# Patient Record
Sex: Female | Born: 1985 | Race: White | Hispanic: No | Smoking: Never smoker
Health system: Southern US, Community
[De-identification: ages and names within clinical notes are randomized; demographics above are authoritative.]

## PROBLEM LIST (undated history)

## (undated) DIAGNOSIS — R011 Cardiac murmur, unspecified: Secondary | ICD-10-CM

## (undated) DIAGNOSIS — M5134 Other intervertebral disc degeneration, thoracic region: Secondary | ICD-10-CM

## (undated) HISTORY — PX: TUBAL LIGATION: SHX77

---

## 2016-06-14 ENCOUNTER — Encounter (HOSPITAL_COMMUNITY): Payer: Self-pay | Admitting: Cardiology

## 2016-06-14 ENCOUNTER — Emergency Department (HOSPITAL_COMMUNITY)
Admission: EM | Admit: 2016-06-14 | Discharge: 2016-06-14 | Disposition: A | Discharge status: Home / Self Care | Payer: Worker's Compensation | Attending: Emergency Medicine | Admitting: Emergency Medicine

## 2016-06-14 ENCOUNTER — Emergency Department (HOSPITAL_COMMUNITY): Payer: Worker's Compensation

## 2016-06-14 DIAGNOSIS — Y9389 Activity, other specified: Secondary | ICD-10-CM | POA: Diagnosis not present

## 2016-06-14 DIAGNOSIS — Y92410 Unspecified street and highway as the place of occurrence of the external cause: Secondary | ICD-10-CM | POA: Diagnosis not present

## 2016-06-14 DIAGNOSIS — Z91040 Latex allergy status: Secondary | ICD-10-CM | POA: Diagnosis not present

## 2016-06-14 DIAGNOSIS — Y990 Civilian activity done for income or pay: Secondary | ICD-10-CM | POA: Insufficient documentation

## 2016-06-14 DIAGNOSIS — S8391XA Sprain of unspecified site of right knee, initial encounter: Secondary | ICD-10-CM | POA: Diagnosis not present

## 2016-06-14 DIAGNOSIS — M79604 Pain in right leg: Secondary | ICD-10-CM | POA: Diagnosis present

## 2016-06-14 HISTORY — DX: Cardiac murmur, unspecified: R01.1

## 2016-06-14 HISTORY — DX: Other intervertebral disc degeneration, thoracic region: M51.34

## 2016-06-14 LAB — POC URINE PREG, ED: Preg Test, Ur: NEGATIVE

## 2016-06-14 MED ORDER — OXYCODONE-ACETAMINOPHEN 5-325 MG PO TABS
1.0000 | ORAL_TABLET | Freq: Once | ORAL | Status: AC
  Administered 2016-06-14: 1 via ORAL
  Filled 2016-06-14: qty 1

## 2016-06-14 MED ORDER — OXYCODONE-ACETAMINOPHEN 5-325 MG PO TABS: 1.0000 | ORAL_TABLET | Freq: Four times a day (QID) | ORAL | 0 refills | Status: DC | PRN

## 2016-06-14 MED ORDER — IBUPROFEN 600 MG PO TABS: 600.0000 mg | ORAL_TABLET | Freq: Three times a day (TID) | ORAL | 0 refills | Status: DC | PRN
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
## 2016-06-14 NOTE — ED Notes (Signed)
MD at bedside. 
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
## 2016-06-14 NOTE — ED Notes (Signed)
Pt ambulatory with no difficulties noted.

## 2016-06-16 NOTE — ED Notes (Signed)
Pt called back inquiring about a work note. 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  pt reported would come and pick up note.

## 2016-06-25 ENCOUNTER — Emergency Department (HOSPITAL_COMMUNITY): Payer: Worker's Compensation

## 2016-06-25 ENCOUNTER — Encounter (HOSPITAL_COMMUNITY): Payer: Self-pay | Admitting: Emergency Medicine

## 2016-06-25 ENCOUNTER — Emergency Department (HOSPITAL_COMMUNITY)
Admission: EM | Admit: 2016-06-25 | Discharge: 2016-06-25 | Disposition: A | Discharge status: Home / Self Care | Payer: Worker's Compensation | Attending: Emergency Medicine | Admitting: Emergency Medicine

## 2016-06-25 DIAGNOSIS — Y939 Activity, unspecified: Secondary | ICD-10-CM | POA: Insufficient documentation

## 2016-06-25 DIAGNOSIS — Y999 Unspecified external cause status: Secondary | ICD-10-CM | POA: Insufficient documentation

## 2016-06-25 DIAGNOSIS — M25461 Effusion, right knee: Secondary | ICD-10-CM | POA: Insufficient documentation

## 2016-06-25 DIAGNOSIS — Y92410 Unspecified street and highway as the place of occurrence of the external cause: Secondary | ICD-10-CM | POA: Insufficient documentation

## 2016-06-25 DIAGNOSIS — Z791 Long term (current) use of non-steroidal anti-inflammatories (NSAID): Secondary | ICD-10-CM | POA: Insufficient documentation

## 2016-06-25 DIAGNOSIS — M25561 Pain in right knee: Secondary | ICD-10-CM | POA: Diagnosis not present

## 2016-06-25 MED ORDER — HYDROCODONE-ACETAMINOPHEN 5-325 MG PO TABS: 2.0000 | ORAL_TABLET | ORAL | 0 refills | Status: AC | PRN

## 2016-06-25 MED ORDER — IBUPROFEN 800 MG PO TABS: 800.0000 mg | ORAL_TABLET | Freq: Three times a day (TID) | ORAL | 0 refills | Status: AC

## 2016-06-25 NOTE — ED Notes (Signed)
Pt to xray at this time.
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
## 2016-06-25 NOTE — ED Notes (Signed)
Pt ambulatory to restroom. States understanding of d/c instructions, follow up care, and need of knee immobilizer X1 week. No questions at this time.

## 2017-05-16 IMAGING — DX DG KNEE COMPLETE 4+V*R*
4 series · 4 of 4 positions shown · non-contrast
Comparison: None.

CLINICAL DATA: Right knee pain

EXAM:
RIGHT KNEE - COMPLETE 4+ VIEW

[knee ap]
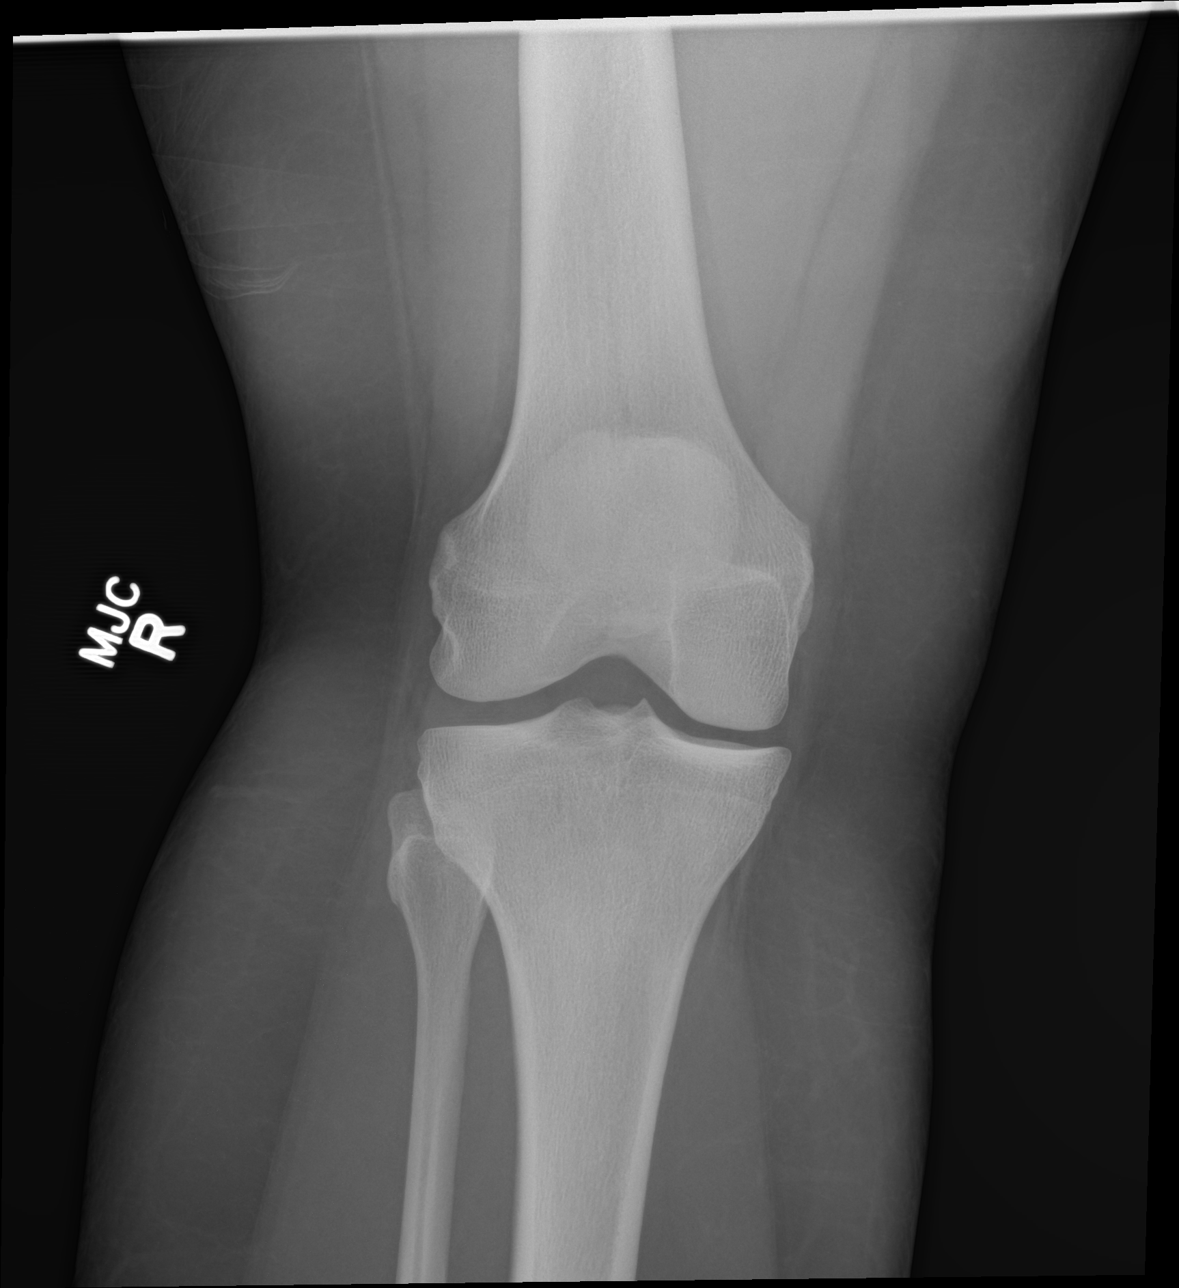

[knee obl (1 of 2)]
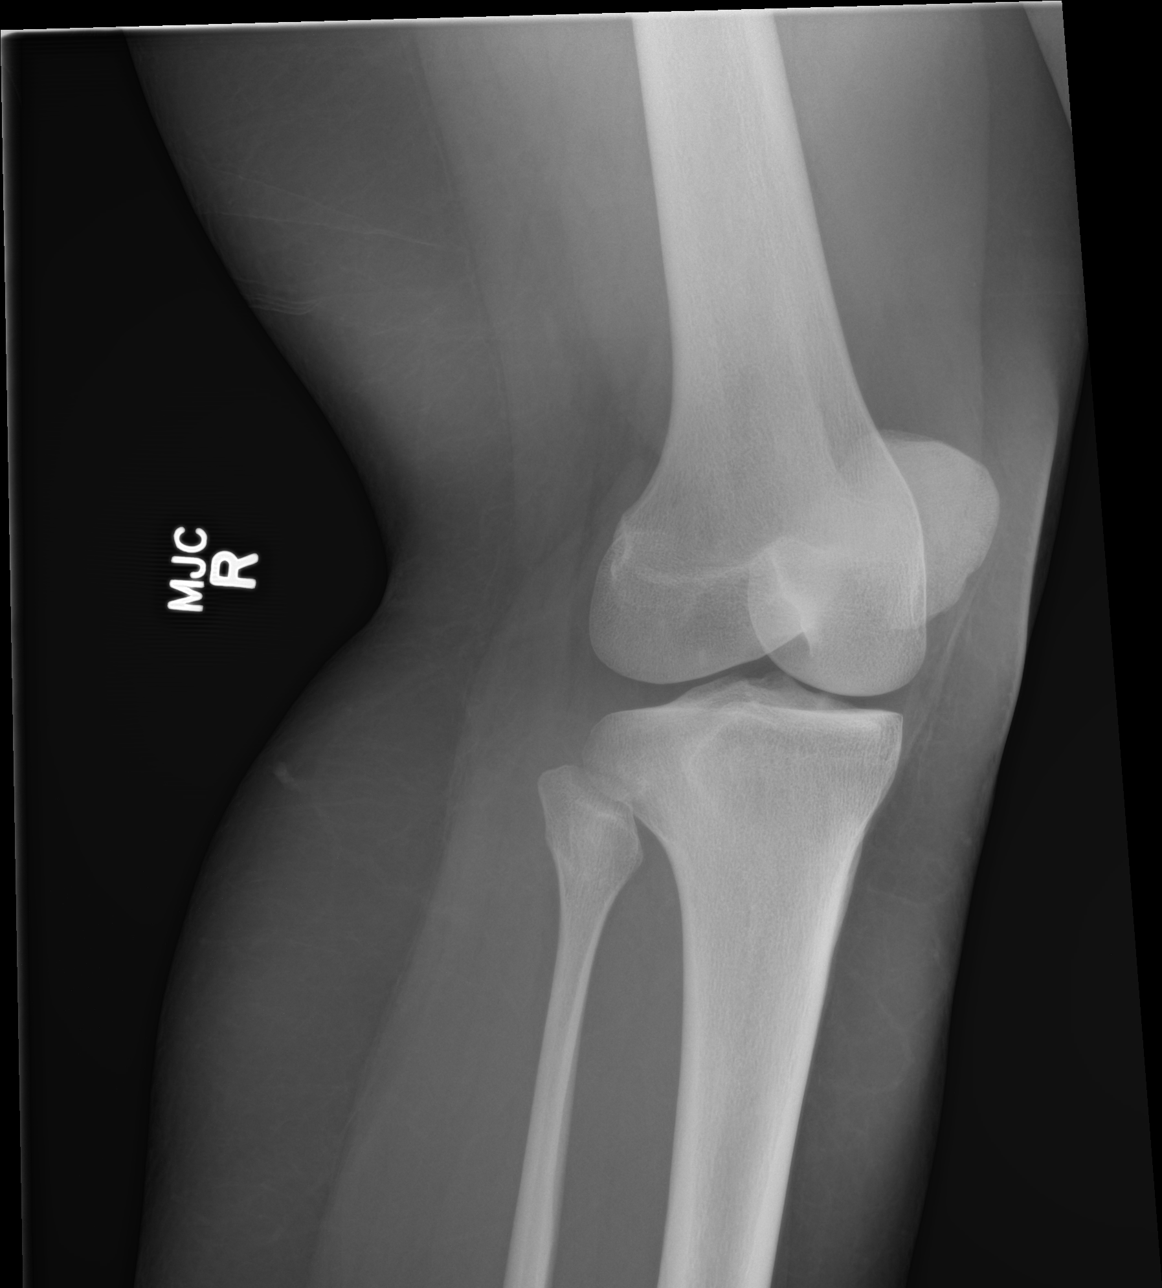

[knee obl (2 of 2)]
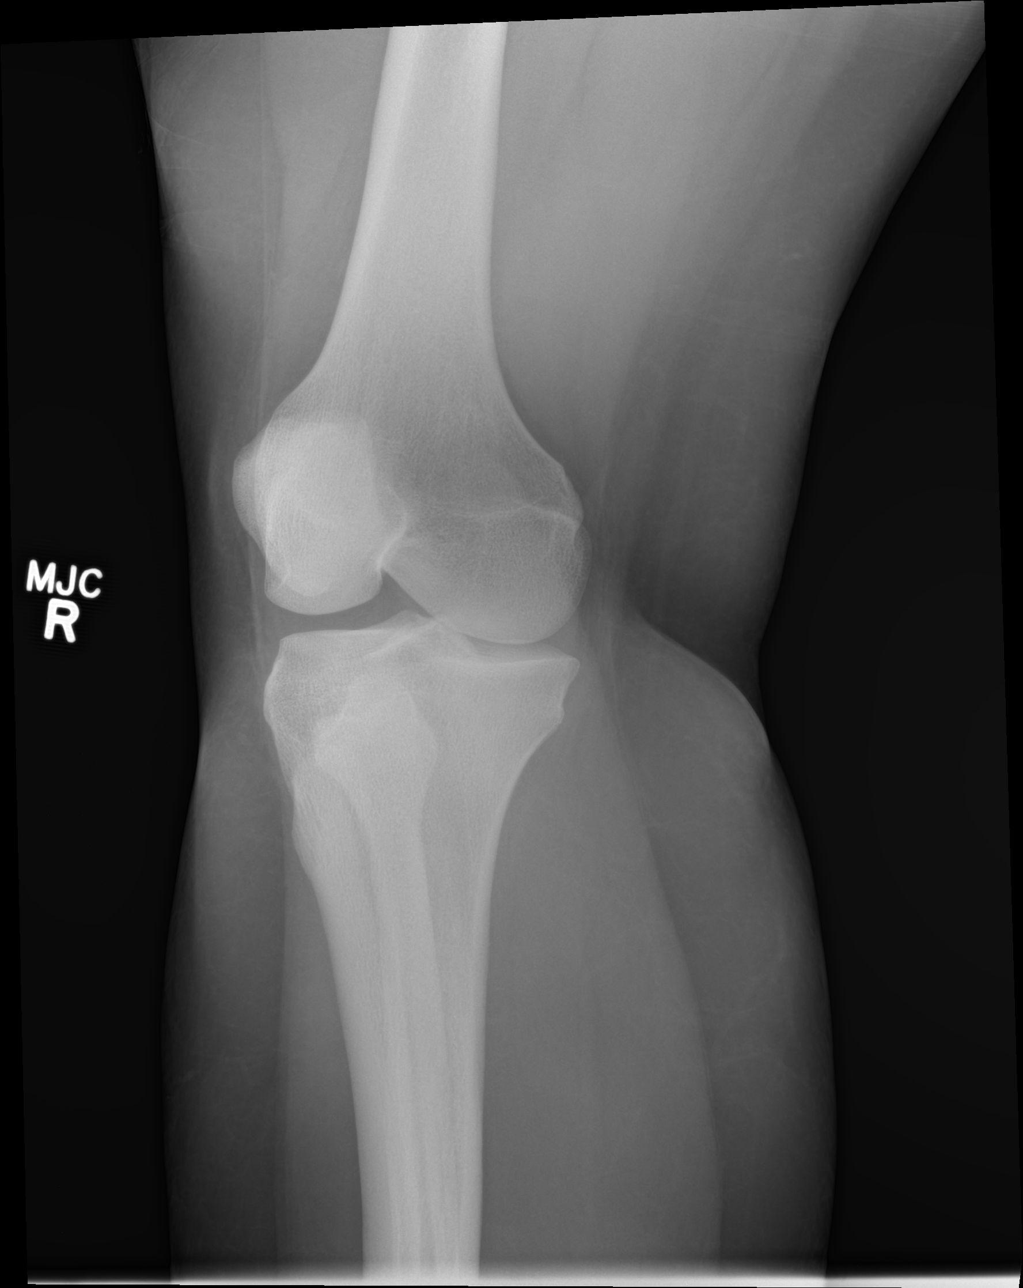

[knee lat]
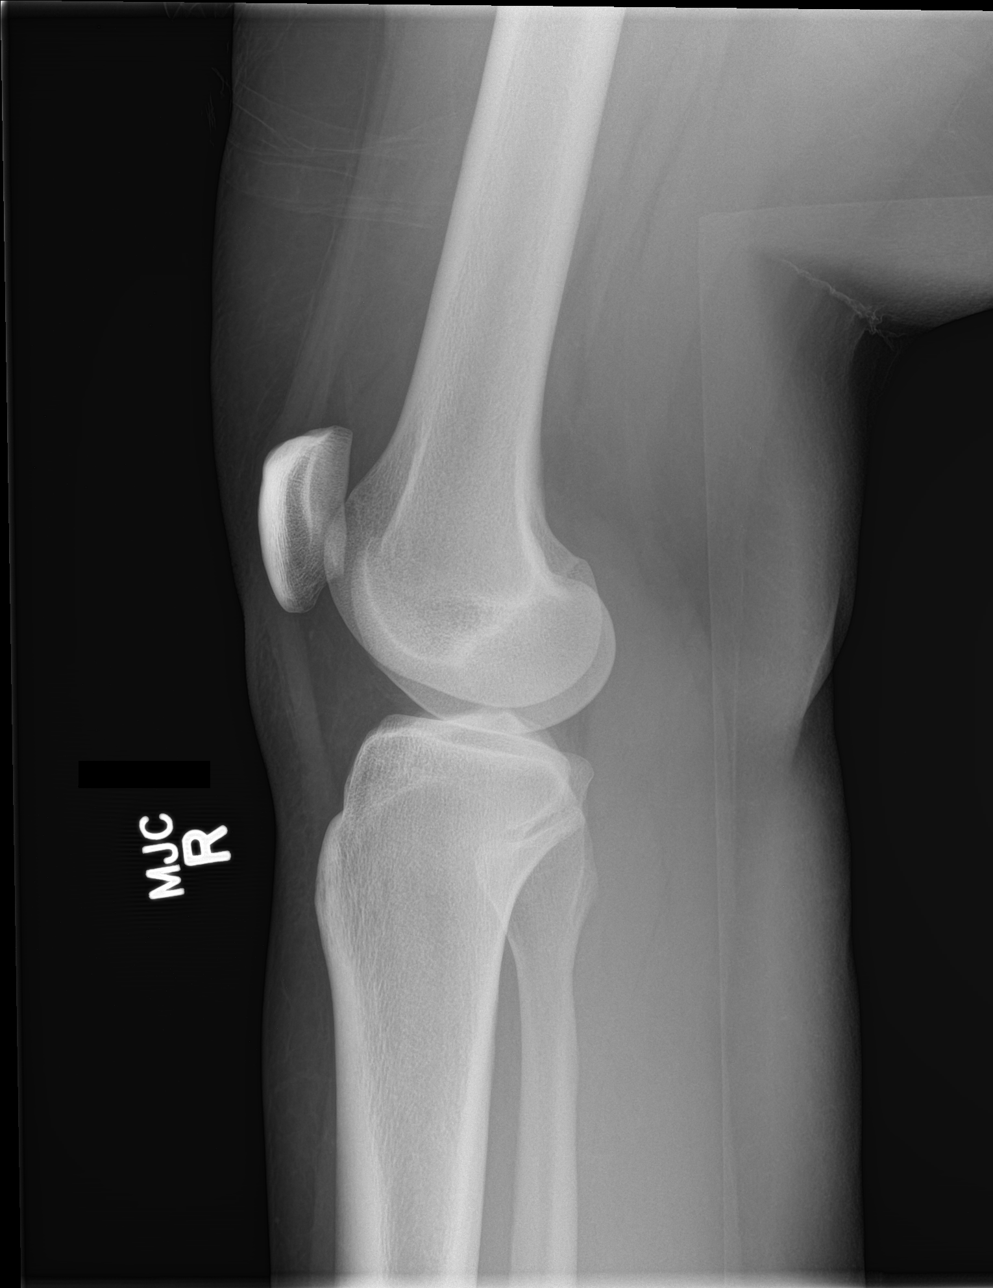

[4 of 4 positions shown; findings below may reference images not displayed]

FINDINGS: No evidence of fracture, dislocation, or joint effusion. No evidence
of arthropathy or other focal bone abnormality. Soft tissues are
unremarkable.
IMPRESSION: Negative.
